# Patient Record
Sex: Female | Born: 1960 | Race: White | Hispanic: No | Marital: Single | Smoking: Never smoker
Health system: Southern US, Community
[De-identification: ages and names within clinical notes are randomized; demographics above are authoritative.]

## PROBLEM LIST (undated history)

## (undated) DIAGNOSIS — Z1211 Encounter for screening for malignant neoplasm of colon: Secondary | ICD-10-CM

## (undated) DIAGNOSIS — Z1231 Encounter for screening mammogram for malignant neoplasm of breast: Principal | ICD-10-CM

## (undated) DIAGNOSIS — E039 Hypothyroidism, unspecified: Secondary | ICD-10-CM

## (undated) DIAGNOSIS — Z780 Asymptomatic menopausal state: Secondary | ICD-10-CM

---

## 2014-07-26 ENCOUNTER — Encounter

## 2014-07-28 ENCOUNTER — Inpatient Hospital Stay: Admit: 2014-07-28 | Payer: PRIVATE HEALTH INSURANCE | Attending: Family Medicine | Primary: Family Medicine

## 2014-07-28 DIAGNOSIS — Z1231 Encounter for screening mammogram for malignant neoplasm of breast: Secondary | ICD-10-CM

## 2015-06-02 ENCOUNTER — Other Ambulatory Visit: Admit: 2015-06-02 | Discharge: 2015-06-02 | Payer: BLUE CROSS/BLUE SHIELD | Primary: Family Medicine

## 2015-06-02 DIAGNOSIS — Z0000 Encounter for general adult medical examination without abnormal findings: Secondary | ICD-10-CM

## 2015-06-02 LAB — AMB POC URINALYSIS DIP STICK AUTO W/O MICRO
Bilirubin (UA POC): NEGATIVE
Glucose (UA POC): NEGATIVE
Ketones (UA POC): NEGATIVE
Leukocyte esterase (UA POC): NEGATIVE
Nitrites (UA POC): NEGATIVE
Protein (UA POC): NEGATIVE mg/dL
Specific gravity (UA POC): 1.02 (ref 1.001–1.035)
Urobilinogen (UA POC): 0.2 (ref 0.2–1)
pH (UA POC): 5.5 (ref 4.6–8.0)

## 2015-06-03 LAB — HEPATITIS C ANTIBODY: HCV Ab: <0.1 s/co ratio (ref 0.0–0.9)

## 2015-06-03 LAB — METABOLIC PANEL, COMPREHENSIVE
A-G Ratio: 1.5 (ref 1.1–2.5)
ALT (SGPT): 13 IU/L (ref 0–32)
AST (SGOT): 20 IU/L (ref 0–40)
Albumin: 4 g/dL (ref 3.5–5.5)
Alk. phosphatase: 52 IU/L (ref 39–117)
BUN/Creatinine ratio: 12 (ref 9–23)
BUN: 11 mg/dL (ref 6–24)
Bilirubin, total: 0.5 mg/dL (ref 0.0–1.2)
CO2: 25 mmol/L (ref 18–29)
Calcium: 9.2 mg/dL (ref 8.7–10.2)
Chloride: 102 mmol/L (ref 96–106)
Creatinine: 0.92 mg/dL (ref 0.57–1.00)
GFR est AA: 82 mL/min/{1.73_m2} (ref >59)
GFR est non-AA: 71 mL/min/{1.73_m2} (ref >59)
GLOBULIN, TOTAL: 2.7 g/dL (ref 1.5–4.5)
Glucose: 89 mg/dL (ref 65–99)
Potassium: 4.3 mmol/L (ref 3.5–5.2)
Protein, total: 6.7 g/dL (ref 6.0–8.5)
Sodium: 143 mmol/L (ref 134–144)

## 2015-06-03 LAB — CBC WITH AUTOMATED DIFF
ABS. BASOPHILS: 0 10*3/uL (ref 0.0–0.2)
ABS. EOSINOPHILS: 0.2 10*3/uL (ref 0.0–0.4)
ABS. IMM. GRANS.: 0 10*3/uL (ref 0.0–0.1)
ABS. MONOCYTES: 0.5 10*3/uL (ref 0.1–0.9)
ABS. NEUTROPHILS: 3.5 10*3/uL (ref 1.4–7.0)
Abs Lymphocytes: 1.5 10*3/uL (ref 0.7–3.1)
BASOPHILS: 1 %
EOSINOPHILS: 4 %
HCT: 41.5 % (ref 34.0–46.6)
HGB: 13.7 g/dL (ref 11.1–15.9)
IMMATURE GRANULOCYTES: 0 %
Lymphocytes: 26 %
MCH: 31 pg (ref 26.6–33.0)
MCHC: 33 g/dL (ref 31.5–35.7)
MCV: 94 fL (ref 79–97)
MONOCYTES: 9 %
NEUTROPHILS: 60 %
PLATELET: 209 10*3/uL (ref 150–379)
RBC: 4.42 x10E6/uL (ref 3.77–5.28)
RDW: 14.5 % (ref 12.3–15.4)
WBC: 5.8 10*3/uL (ref 3.4–10.8)

## 2015-06-03 LAB — LIPID PANEL WITH LDL/HDL RATIO
Cholesterol, total: 232 mg/dL — ABNORMAL HIGH (ref 100–199)
HDL Cholesterol: 66 mg/dL (ref >39)
LDL, calculated: 149 mg/dL — ABNORMAL HIGH (ref 0–99)
LDL/HDL Ratio: 2.3 ratio units (ref 0.0–3.2)
Triglyceride: 84 mg/dL (ref 0–149)
VLDL, calculated: 17 mg/dL (ref 5–40)

## 2015-06-03 LAB — TSH 3RD GENERATION: TSH: 4.51 u[IU]/mL — ABNORMAL HIGH (ref 0.450–4.500)

## 2015-06-03 LAB — HEPATITIS C AB: Hep C Virus Ab: <0.1 s/co ratio (ref 0.0–0.9)

## 2015-06-07 ENCOUNTER — Ambulatory Visit
Admit: 2015-06-07 | Discharge: 2015-06-07 | Payer: BLUE CROSS/BLUE SHIELD | Attending: Nurse Practitioner | Primary: Family Medicine

## 2015-06-07 DIAGNOSIS — Z1239 Encounter for other screening for malignant neoplasm of breast: Secondary | ICD-10-CM

## 2015-06-07 NOTE — Progress Notes (Signed)
Alyssa Reese  [id number]  [birth year]    Chief Complaint:   Chief Complaint   Patient presents with   ??? Complete Physical     last mammogram was 07-26-14; no colonoscopy yet; last pap done last year at [facility]     History of Present Illness:  Alyssa Reese is a 55 y.o. female who presents for annual adult CPX.  She is currently doing well, denies complaints and concerns.  Labs reviewed, lipid panel, diet and exercise discussed.  Plan for repeat labs in 6 months.  HM reviewed--she is agreeable with mammogram, immunization and is considering colonoscopy vs cologuard.  No previous DERM or eye exam.  She is agreeable with these referrals.    Review of Systems:  Review of Systems   Constitutional: Negative.    HENT: Negative.    Eyes: Negative.    Respiratory: Negative.    Cardiovascular: Negative.    Gastrointestinal: Negative.    Genitourinary: Negative.    Musculoskeletal: Negative.    Skin: Negative.    Neurological: Negative.    Endo/Heme/Allergies: Negative.    Psychiatric/Behavioral: Negative.        Medications:  Current Outpatient Prescriptions   Medication Sig   ??? multivitamin (ONE A DAY) tablet Take 1 Tab by mouth daily.     No current facility-administered medications for this visit.        Past Medical History:  Past Medical History   Diagnosis Date   ??? Fibrocystic disease of both breasts    ??? Shingles 05/02/2015       Surgical History:  History reviewed. No pertinent past surgical history.    Family History:  Family History   Problem Relation Age of Onset   ??? Breast Cancer Paternal Aunt    ??? Cancer Father      kidney cancer   ??? Cancer Paternal Grandmother 70     colon       Social History:  Social History     Social History   ??? Marital status: SINGLE     Spouse name: N/A   ??? Number of children: N/A   ??? Years of education: N/A     Occupational History   ??? Not on file.     Social History Main Topics   ??? Smoking status: Former Smoker     Quit date: 06/19/1998   ??? Smokeless tobacco: Never Used    ??? Alcohol use Not on file   ??? Drug use: Not on file   ??? Sexual activity: Not on file     Other Topics Concern   ??? Not on file     Social History Narrative       History   Smoking Status   ??? Former Smoker   ??? Quit date: 06/19/1998   Smokeless Tobacco   ??? Never Used       Allergies:  Allergies   Allergen Reactions   ??? Shellfish Derived Nausea and Vomiting       Vital Signs:  Vitals:    06/07/15 1310   BP: 100/62   Weight: 151 lb (68.5 kg)   Height: 5' 4.5" (1.638 m)     Body mass index is 25.52 kg/(m^2).    Lab/Imaging Results:  Results for orders placed or performed in visit on 06/02/15   CBC WITH AUTOMATED DIFF   Result Value Ref Range    WBC 5.8 3.4 - 10.8 x10E3/uL    RBC 4.42 3.77 - 5.28 x10E6/uL  HGB 13.7 11.1 - 15.9 g/dL    HCT 41.5 34.0 - 46.6 %    MCV 94 79 - 97 fL    MCH 31.0 26.6 - 33.0 pg    MCHC 33.0 31.5 - 35.7 g/dL    RDW 14.5 12.3 - 15.4 %    PLATELET 209 150 - 379 x10E3/uL    NEUTROPHILS 60 %    Lymphocytes 26 %    MONOCYTES 9 %    EOSINOPHILS 4 %    BASOPHILS 1 %    ABS. NEUTROPHILS 3.5 1.4 - 7.0 x10E3/uL    Abs Lymphocytes 1.5 0.7 - 3.1 x10E3/uL    ABS. MONOCYTES 0.5 0.1 - 0.9 x10E3/uL    ABS. EOSINOPHILS 0.2 0.0 - 0.4 x10E3/uL    ABS. BASOPHILS 0.0 0.0 - 0.2 x10E3/uL    IMMATURE GRANULOCYTES 0 %    ABS. IMM. GRANS. 0.0 0.0 - 0.1 R67E9/FY   METABOLIC PANEL, COMPREHENSIVE   Result Value Ref Range    Glucose 89 65 - 99 mg/dL    BUN 11 6 - 24 mg/dL    Creatinine 0.92 0.57 - 1.00 mg/dL    GFR est non-AA 71 >59 mL/min/1.73    GFR est AA 82 >59 mL/min/1.73    BUN/Creatinine ratio 12 9 - 23    Sodium 143 134 - 144 mmol/L    Potassium 4.3 3.5 - 5.2 mmol/L    Chloride 102 96 - 106 mmol/L    CO2 25 18 - 29 mmol/L    Calcium 9.2 8.7 - 10.2 mg/dL    Protein, total 6.7 6.0 - 8.5 g/dL    Albumin 4.0 3.5 - 5.5 g/dL    GLOBULIN, TOTAL 2.7 1.5 - 4.5 g/dL    A-G Ratio 1.5 1.1 - 2.5    Bilirubin, total 0.5 0.0 - 1.2 mg/dL    Alk. phosphatase 52 39 - 117 IU/L    AST 20 0 - 40 IU/L    ALT 13 0 - 32 IU/L    TSH 3RD GENERATION   Result Value Ref Range    TSH 4.510 (H) 0.450 - 4.500 uIU/mL   LIPID PANEL WITH LDL/HDL RATIO   Result Value Ref Range    Cholesterol, total 232 (H) 100 - 199 mg/dL    Triglyceride 84 0 - 149 mg/dL    HDL Cholesterol 66 >39 mg/dL    VLDL, calculated 17 5 - 40 mg/dL    LDL, calculated 149 (H) 0 - 99 mg/dL    LDL/HDL Ratio 2.3 0.0 - 3.2 ratio units   HEPATITIS C AB   Result Value Ref Range    Hep C Virus Ab <0.1 0.0 - 0.9 s/co ratio   AMB POC URINALYSIS DIP STICK AUTO W/O MICRO   Result Value Ref Range    Color (UA POC) Yellow     Clarity (UA POC) Clear     Glucose (UA POC) Negative Negative    Bilirubin (UA POC) Negative Negative    Ketones (UA POC) Negative Negative    Specific gravity (UA POC) 1.020 1.001 - 1.035    Blood (UA POC) Trace Negative    pH (UA POC) 5.5 4.6 - 8.0    Protein (UA POC) Negative Negative mg/dL    Urobilinogen (UA POC) 0.2 mg/dL 0.2 - 1    Nitrites (UA POC) Negative Negative    Leukocyte esterase (UA POC) Negative Negative         Physical Exam:  Physical Exam  Constitutional: She is oriented to person, place, and time and well-developed, well-nourished, and in no distress. Vital signs are normal.   HENT:   Head: Normocephalic and atraumatic.   Right Ear: Hearing, tympanic membrane, external ear and ear canal normal.   Left Ear: Hearing, tympanic membrane, external ear and ear canal normal.   Nose: Nose normal.   Mouth/Throat: Uvula is midline, oropharynx is clear and moist and mucous membranes are normal.   Eyes: Conjunctivae, EOM and lids are normal. Pupils are equal, round, and reactive to light.   Neck: Trachea normal, normal range of motion and full passive range of motion without pain. Neck supple. No JVD present. No tracheal deviation present. No thyromegaly present.   Cardiovascular: Normal rate, regular rhythm, normal heart sounds, intact distal pulses and normal pulses.    No murmur heard.  Pulmonary/Chest: Effort normal and breath sounds normal. No accessory  muscle usage. No respiratory distress. She has no decreased breath sounds. She has no wheezes. She has no rhonchi. She has no rales.   Abdominal: Soft. Normal appearance, normal aorta and bowel sounds are normal. She exhibits no distension. There is no hepatosplenomegaly. There is no tenderness. There is no rebound and no guarding.   Musculoskeletal: Normal range of motion. She exhibits no edema or tenderness.   Lymphadenopathy:     She has no cervical adenopathy.   Neurological: She is alert and oriented to person, place, and time. She has normal sensation, normal strength, normal reflexes and intact cranial nerves. Gait normal. GCS score is 15.   Skin: Skin is warm, dry and intact.   Psychiatric: Mood, memory, affect and judgment normal.   Nursing note and vitals reviewed.      Assessment and Plan:    ICD-10-CM ICD-9-CM    1. Breast cancer screening Z12.39 V76.10 MAM MAMMO BI SCREENING INCL CAD   2. Mixed hyperlipidemia E78.2 272.2    3. Screening Z13.9 V82.9 REFERRAL TO DERMATOLOGY      REFERRAL TO OPHTHALMOLOGY   4. Encounter for immunization Z23 V03.89        Considering colonoscopy vs cologuard  Follow-up Disposition:  Return in about 6 months (around 12/05/2015) for repeat fasting lipid panel and TSH.      Milagros Loll, NP

## 2015-06-07 NOTE — Patient Instructions (Signed)
Vaccine Information Statement    Influenza (Flu) Vaccine (Inactivated or Recombinant): What you need to know    Many Vaccine Information Statements are available in Spanish and other languages. See www.immunize.org/vis  Hojas de Informaci??n Sobre Vacunas est??n disponibles en Espa??ol y en muchos otros idiomas. Visite www.immunize.org/vis    1. Why get vaccinated?    Influenza (???flu???) is a contagious disease that spreads around the [place] every year, usually between October and May.     Flu is caused by influenza viruses, and is spread mainly by coughing, sneezing, and close contact.     Anyone can get flu. Flu strikes suddenly and can last several days. Symptoms vary by age, but can include:  ??? fever/chills  ??? sore throat  ??? muscle aches  ??? fatigue  ??? cough  ??? headache   ??? runny or stuffy nose    Flu can also lead to pneumonia and blood infections, and cause diarrhea and seizures in children.  If you have a medical condition, such as heart or lung disease, flu can make it worse.    Flu is more dangerous for some people. Infants and young children, people 65 years of age and older, pregnant women, and people with certain health conditions or a weakened immune system are at greatest risk.      Each year thousands of people in the [place] die from flu, and many more are hospitalized.     Flu vaccine can:  ??? keep you from getting flu,  ??? make flu less severe if you do get it, and  ??? keep you from spreading flu to your family and other people.     2. Inactivated and recombinant flu vaccines    A dose of flu vaccine is recommended every flu season. Children 6 months through 8 years of age may need two doses during the same flu season.  Everyone else needs only one dose each flu season.       Some inactivated flu vaccines contain a very small amount of a mercury-based preservative called thimerosal. Studies have not shown thimerosal in vaccines to be harmful, but flu vaccines that do not contain  thimerosal are available.    There is no live flu virus in flu shots.  They cannot cause the flu.     There are many flu viruses, and they are always changing. Each year a new flu vaccine is made to protect against three or four viruses that are likely to cause disease in the upcoming flu season. But even when the vaccine doesn???t exactly match these viruses, it may still provide some protection    Flu vaccine cannot prevent:  ??? flu that is caused by a virus not covered by the vaccine, or  ??? illnesses that look like flu but are not.    It takes about 2 weeks for protection to develop after vaccination, and protection lasts through the flu season.     3. Some people should not get this vaccine    Tell the person who is giving you the vaccine:    ??? If you have any severe, life-threatening allergies.    If you ever had a life-threatening allergic reaction after a dose of flu vaccine, or have a severe allergy to any part of this vaccine, you may be advised not to get vaccinated.  Most, but not all, types of flu vaccine contain a small amount of egg protein.       ??? If you   ever had Guillain-Barr?? Syndrome (also called GBS).   Some people with a history of GBS should not get this vaccine. This should be discussed with your doctor.    ??? If you are not feeling well.    It is usually okay to get flu vaccine when you have a mild illness, but you might be asked to come back when you feel better.      4. Risks of a vaccine reaction    With any medicine, including vaccines, there is a chance of reactions. These are usually mild and go away on their own, but serious reactions are also possible.     Most people who get a flu shot do not have any problems with it.     Minor problems following a flu shot include:   ??? soreness, redness, or swelling where the shot was given    ??? hoarseness  ??? sore, red or itchy eyes  ??? cough  ??? fever  ??? aches  ??? headache  ??? itching  ??? fatigue   If these problems occur, they usually begin soon after the shot and last 1 or 2 days.     More serious problems following a flu shot can include the following:    ??? There may be a small increased risk of Guillain-Barr?? Syndrome (GBS) after inactivated flu vaccine.  This risk has been estimated at 1 or 2 additional cases per million people vaccinated. This is much lower than the risk of severe complications from flu, which can be prevented by flu vaccine.      ??? Young children who get the flu shot along with pneumococcal vaccine (PCV13) and/or DTaP vaccine at the same time might be slightly more likely to have a seizure caused by fever. Ask your doctor for more information. Tell your doctor if a child who is getting flu vaccine has ever had a seizure.     Problems that could happen after any injected vaccine:     ??? People sometimes faint after a medical procedure, including vaccination. Sitting or lying down for about 15 minutes can help prevent fainting, and injuries caused by a fall. Tell your doctor if you feel dizzy, or have vision changes or ringing in the ears.    ??? Some people get severe pain in the shoulder and have difficulty moving the arm where a shot was given. This happens very rarely.    ??? Any medication can cause a severe allergic reaction. Such reactions from a vaccine are very rare, estimated at about 1 in a million doses, and would happen within a few minutes to a few hours after the vaccination.    As with any medicine, there is a very remote chance of a vaccine causing a serious injury or death.    The safety of vaccines is always being monitored. For more information, visit: www.cdc.gov/vaccinesafety/    5. What if there is a serious reaction?    What should I look for?    ??? Look for anything that concerns you, such as signs of a severe allergic reaction, very high fever, or unusual behavior.    Signs of a severe allergic reaction can include hives, swelling of the  face and throat, difficulty breathing, a fast heartbeat, dizziness, and weakness ??? usually within a few minutes to a few hours after the vaccination.    What should I do?    ??? If you think it is a severe allergic reaction or other emergency that   can???t wait, call 9-1-1 and get the person to the nearest hospital. Otherwise, call your doctor.    ??? Reactions should be reported to the Vaccine Adverse Event Reporting System (VAERS). Your doctor should file this report, or you can do it yourself through  the VAERS web site at www.vaers.hhs.gov, or by calling 1-800-822-7967.    VAERS does not give medical advice.    6. The National Vaccine Injury Compensation Program    The National Vaccine Injury Compensation Program (VICP) is a federal program that was created to compensate people who may have been injured by certain vaccines.    Persons who believe they may have been injured by a vaccine can learn about the program and about filing a claim by calling 1-800-338-2382 or visiting the VICP website at www.hrsa.gov/vaccinecompensation.  There is a time limit to file a claim for compensation.    7. How can I learn more?  ??? Ask your healthcare provider. He or she can give you the vaccine package insert or suggest other sources of information.  ??? Call your local or state health department.  ??? Contact the Centers for Disease Control and Prevention (CDC):  - Call 1-800-232-4636 (1-800-CDC-INFO) or  - Visit CDC???s website at www.cdc.gov/flu    Vaccine Information Statement   Inactivated Influenza Vaccine   12/24/2013  42 [place] ?? 300aa-[id number]    Department of Health and Human Services  Centers for Disease Control and Prevention    Office Use Only

## 2015-07-06 ENCOUNTER — Telehealth

## 2015-07-06 NOTE — Telephone Encounter (Signed)
[employer] will not pay for cologuard.  Pt requesting referral to [facility] for colonoscopy.

## 2015-07-06 NOTE — Telephone Encounter (Signed)
Referral for Screening colonoscopy placed.

## 2015-07-31 ENCOUNTER — Inpatient Hospital Stay: Admit: 2015-07-31 | Payer: BLUE CROSS/BLUE SHIELD | Primary: Family Medicine

## 2015-07-31 DIAGNOSIS — Z1231 Encounter for screening mammogram for malignant neoplasm of breast: Secondary | ICD-10-CM

## 2015-08-01 NOTE — Progress Notes (Signed)
Normal mammo, notified by radiology staff

## 2016-01-05 ENCOUNTER — Other Ambulatory Visit: Admit: 2016-01-05 | Discharge: 2016-01-05 | Payer: BLUE CROSS/BLUE SHIELD | Primary: Family Medicine

## 2016-01-05 DIAGNOSIS — R6889 Other general symptoms and signs: Secondary | ICD-10-CM

## 2016-01-06 LAB — LIPID PANEL WITH LDL/HDL RATIO
Cholesterol, total: 210 mg/dL — ABNORMAL HIGH (ref 100–199)
HDL Cholesterol: 67 mg/dL (ref >39)
LDL, calculated: 131 mg/dL — ABNORMAL HIGH (ref 0–99)
LDL/HDL Ratio: 2 ratio units (ref 0.0–3.2)
Triglyceride: 58 mg/dL (ref 0–149)
VLDL, calculated: 12 mg/dL (ref 5–40)

## 2016-01-06 LAB — TSH 3RD GENERATION: TSH: 4.43 u[IU]/mL (ref 0.450–4.500)

## 2016-01-10 NOTE — Progress Notes (Signed)
Labs mailed 01/10/16  pl

## 2016-01-15 NOTE — Telephone Encounter (Signed)
Pt notified of results and voiced understanding.

## 2016-01-15 NOTE — Telephone Encounter (Signed)
Pt calling to get lab results

## 2016-05-06 ENCOUNTER — Encounter: Attending: Nurse Practitioner | Primary: Family Medicine

## 2016-05-07 ENCOUNTER — Ambulatory Visit
Admit: 2016-05-07 | Discharge: 2016-05-07 | Payer: BLUE CROSS/BLUE SHIELD | Attending: Nurse Practitioner | Primary: Family Medicine

## 2016-05-07 DIAGNOSIS — N951 Menopausal and female climacteric states: Secondary | ICD-10-CM

## 2016-05-07 NOTE — Progress Notes (Signed)
Alyssa Reese  [id number]  12/13/1960    Chief Complaint:   Chief Complaint   Patient presents with   ??? Menopause     "menopausal changes".  "hot flashes have gotten really bad".  Has used OTC remedies with no improvement.      History of Present Illness:  See above. Presents with c/o perimenopausal symptoms, started approx 5 years ago, seems to be worsening over the past few months. A/w emotional lability, painful intercourse. Patient has h/o "anger issues, hot-tempered" even prior to menopause. Last menstrual cycle was January 2017. No personal history of cancer. Paternal aunt with breast cancer. No other breast cancer in the family. She is UTD with screenings for mammogram and colonoscopy.  Discussed treatment options for HRT vs effexor and intrarosa.    Review of Systems:  Review of Systems   Constitutional: Negative.         Hotflashes   HENT: Negative.    Eyes: Negative.    Respiratory: Negative.    Cardiovascular: Negative.    Gastrointestinal: Negative.    Genitourinary: Negative.         Vaginal dryness   Musculoskeletal: Negative.    Skin: Negative.    Neurological: Negative.    Endo/Heme/Allergies: Negative.    Psychiatric/Behavioral: Negative.         Labile moods         Medications:  Current Outpatient Prescriptions   Medication Sig   ??? cyanocobalamin (VITAMIN B-12) 1,000 mcg tablet Take 1,000 mcg by mouth daily.   ??? multivitamin (ONE A DAY) tablet Take 1 Tab by mouth daily.     No current facility-administered medications for this visit.        Past Medical History:  Past Medical History:   Diagnosis Date   ??? Fibrocystic disease of both breasts    ??? Shingles 05/02/2015       Surgical History:  Past Surgical History:   Procedure Laterality Date   ??? HX PREMALIG/BENIGN SKIN LESION EXCISION  08/2015    chest       Family History:  Family History   Problem Relation Age of Onset   ??? Breast Cancer Paternal Aunt    ??? Cancer Father      kidney cancer   ??? Cancer Paternal Grandmother [id number]     colon        Social History:  Social History     Social History   ??? Marital status: SINGLE     Spouse name: N/A   ??? Number of children: N/A   ??? Years of education: N/A     Occupational History   ??? Not on file.     Social History Main Topics   ??? Smoking status: Former Smoker     Quit date: 06/19/1998   ??? Smokeless tobacco: Never Used   ??? Alcohol use 1.8 oz/week     3 Glasses of wine per week   ??? Drug use: Not on file   ??? Sexual activity: Not on file     Other Topics Concern   ??? Not on file     Social History Narrative       History   Smoking Status   ??? Former Smoker   ??? Quit date: 06/19/1998   Smokeless Tobacco   ??? Never Used       Allergies:  Allergies   Allergen Reactions   ??? Shellfish Derived Nausea and Vomiting       Vital Signs:  Vitals:    05/07/16 1045   BP: 110/76   Pulse: 78   SpO2: 99%   Weight: 143 lb (64.9 kg)   Height: 5' 4.5" (1.638 m)     Body mass index is 24.17 kg/(m^2).    Lab/Imaging Results:  Results for orders placed or performed in visit on 01/05/16   TSH 3RD GENERATION   Result Value Ref Range    TSH 4.430 0.450 - 4.500 uIU/mL   LIPID PANEL WITH LDL/HDL RATIO   Result Value Ref Range    Cholesterol, total 210 (H) 100 - 199 mg/dL    Triglyceride 58 0 - 149 mg/dL    HDL Cholesterol 67 >16>39 mg/dL    VLDL, calculated 12 5 - 40 mg/dL    LDL, calculated 109131 (H) 0 - 99 mg/dL    LDL/HDL Ratio 2.0 0.0 - 3.2 ratio units         Physical Exam:  Physical Exam   Constitutional: She is oriented to person, place, and time and well-developed, well-nourished, and in no distress.   HENT:   Head: Normocephalic and atraumatic.   Eyes: EOM are normal.   Neck: Normal range of motion. Neck supple.   Pulmonary/Chest: Effort normal.   Musculoskeletal: Normal range of motion.   Neurological: She is alert and oriented to person, place, and time. Gait normal.   Skin: Skin is warm and dry.   Psychiatric: Mood, memory, affect and judgment normal.   Nursing note and vitals reviewed.      Assessment and Plan:    ICD-10-CM ICD-9-CM     1. Perimenopausal symptoms N95.1 627.2 PROGESTERONE      FSH AND LH      ESTRADIOL     Follow labs  Consider starting low dose HRT     Collier BullockJennifer D Rayfield Beem, NP

## 2016-05-08 LAB — ESTRADIOL: Estradiol: 35.2 pg/mL

## 2016-05-08 LAB — FSH AND LH
FSH: 69.7 m[IU]/mL
Luteinizing hormone: 64.2 m[IU]/mL

## 2016-05-08 LAB — PROGESTERONE: Progesterone: 0.1 ng/mL

## 2016-05-08 NOTE — Progress Notes (Signed)
3/4 labs confirm post-menopausal. Recommend trial of low dose estradiol 1 mg po daily for 21 days with 7 days off of medication. Follow up in office in 8 weeks.

## 2016-05-09 MED ORDER — ESTRADIOL 1 MG TAB
1 mg | ORAL_TABLET | ORAL | 2 refills | Status: DC
Start: 2016-05-09 — End: 2016-06-27

## 2016-05-09 NOTE — Progress Notes (Signed)
Labs reviewed with patient.  She is agreeable to start estradiol and will schedule f/u in 2 months.

## 2016-06-04 ENCOUNTER — Encounter: Attending: Nurse Practitioner | Primary: Family Medicine

## 2016-06-27 ENCOUNTER — Ambulatory Visit
Admit: 2016-06-27 | Discharge: 2016-06-27 | Payer: BLUE CROSS/BLUE SHIELD | Attending: Nurse Practitioner | Primary: Family Medicine

## 2016-06-27 DIAGNOSIS — R232 Flushing: Secondary | ICD-10-CM

## 2016-06-27 MED ORDER — ESTRADIOL 0.05 MG-NORETHINDRONE 0.14 MG/24 HR SEMIWKLY TRANSDERM PATCH
MEDICATED_PATCH | TRANSDERMAL | 2 refills | Status: DC
Start: 2016-06-27 — End: 2016-07-01

## 2016-06-27 NOTE — Telephone Encounter (Signed)
Pt just seen today and her Estradiol patch cost $200.  Would like to try 2 mg instead.

## 2016-06-27 NOTE — Progress Notes (Signed)
Alyssa Reese  [id number]  07/12/1960    Chief Complaint:   Chief Complaint   Patient presents with   ??? Menopause     follow up after being placed on estradiol for post menopausal flushing.  Reports some improvement, "but not much"     History of Present Illness:  At previous visit, patient requested trial of estradiol, which does not seem to be helping. Explained the need to add progesterone, recommended combipatch after discussion with her PCP. Patient declined due to cost. Recommended alternative treatment. Also discussed trial of SSRI for hotflashes and mood swings but patient declines this as well.     Review of Systems:  Review of Systems   Constitutional: Negative.         Hot flashes  Mood swings  Vagina dryness   HENT: Negative.    Eyes: Negative.    Respiratory: Negative.    Cardiovascular: Negative.    Gastrointestinal: Negative.    Genitourinary: Negative.    Musculoskeletal: Negative.    Skin: Negative.    Neurological: Negative.    Endo/Heme/Allergies: Negative.    Psychiatric/Behavioral: Negative.        Medications:  Current Outpatient Prescriptions   Medication Sig   ??? estradiol (ESTRACE) 1 mg tablet 1 tablet daily for 21 days;  Off for 7 days.   ??? cyanocobalamin (VITAMIN B-12) 1,000 mcg tablet Take 1,000 mcg by mouth daily.   ??? multivitamin (ONE A DAY) tablet Take 1 Tab by mouth daily.     No current facility-administered medications for this visit.        Past Medical History:  Past Medical History:   Diagnosis Date   ??? Fibrocystic disease of both breasts    ??? Post-menopause     hot flashes   ??? Shingles 05/02/2015   ??? Skin cancer     Basal cell of left chest       Surgical History:  Past Surgical History:   Procedure Laterality Date   ??? HX PREMALIG/BENIGN SKIN LESION EXCISION  08/2015    chest       Family History:  Family History   Problem Relation Age of Onset   ??? Breast Cancer Paternal Aunt    ??? Cancer Father      kidney cancer   ??? Cancer Paternal Grandmother [phone]     colon       Social History:   Social History     Social History   ??? Marital status: SINGLE     Spouse name: N/A   ??? Number of children: N/A   ??? Years of education: N/A     Occupational History   ??? Not on file.     Social History Main Topics   ??? Smoking status: Former Smoker     Quit date: 06/19/1998   ??? Smokeless tobacco: Never Used   ??? Alcohol use 1.8 oz/week     3 Glasses of wine per week   ??? Drug use: Not on file   ??? Sexual activity: Not on file     Other Topics Concern   ??? Not on file     Social History Narrative       History   Smoking Status   ??? Former Smoker   ??? Quit date: 06/19/1998   Smokeless Tobacco   ??? Never Used       Allergies:  Allergies   Allergen Reactions   ??? Shellfish Derived Nausea and Vomiting  Vital Signs:  Vitals:    06/27/16 0857   BP: 102/70   Pulse: 68   SpO2: 98%   Weight: 145 lb (65.8 kg)   Height: 5\' 4"  (1.626 m)     Body mass index is 24.89 kg/(m^2).    Lab/Imaging Results:  Results for orders placed or performed in visit on 05/07/16   [facility]   Result Value Ref Range    Luteinizing hormone 64.2 mIU/mL    FSH 69.7 mIU/mL   ESTRADIOL   Result Value Ref Range    Estradiol 35.2 pg/mL   PROGESTERONE   Result Value Ref Range    Progesterone 0.1 ng/mL       Physical Exam:  Physical Exam   Constitutional: She is oriented to person, place, and time and well-developed, well-nourished, and in no distress.   HENT:   Head: Normocephalic and atraumatic.   Eyes: EOM are normal.   Neck: Normal range of motion. Neck supple.   Pulmonary/Chest: Effort normal.   Musculoskeletal: Normal range of motion.   Neurological: She is alert and oriented to person, place, and time. Gait normal.   Skin: Skin is warm and dry.   Psychiatric: Mood, memory and judgment normal. She has a flat affect.   Nursing note and vitals reviewed.        Assessment and Plan:    ICD-10-CM ICD-9-CM    1. Vasomotor flushing R23.2 782.62 norethindrone-ethinyl estradiol (FEMHRT LOW DOSE) 0.5-2.5 mg-mcg per tablet       DISCONTINUED: estradiol-norethindrone (COMBIPATCH) 0.05-0.14 mg/24 hr   2. Post-menopause Z78.0 V49.81 norethindrone-ethinyl estradiol (FEMHRT LOW DOSE) 0.5-2.5 mg-mcg per tablet      DISCONTINUED: estradiol-norethindrone (COMBIPATCH) 0.05-0.14 mg/24 hr         Collier Bullock, NP

## 2016-06-28 NOTE — Telephone Encounter (Signed)
Dr Alveda ReasonsGiambalvo did not recommend Estrace but did recommend Combipatch because pt needs estrogen-progesterone combination.  Pt did not absorb po med well but can try Prempro or FemHRT if desired.  Pt needs to check with insurance for coverage/cost of Prempro or FemHRT.

## 2016-07-01 MED ORDER — NORETHINDRONE ACETATE 0.5 MG-ETHINYL ESTRADIOL 2.5 MCG TABLET
ORAL_TABLET | Freq: Every day | ORAL | 5 refills | Status: DC
Start: 2016-07-01 — End: 2016-08-28

## 2016-07-01 NOTE — Telephone Encounter (Signed)
[facility]  Patient notified of change and recommended to check with pharmacy for goodRx card.  Call with any additional concerns.  Maryagnes Amos/jde

## 2016-07-03 ENCOUNTER — Encounter: Attending: Obstetrics & Gynecology | Primary: Family Medicine

## 2016-07-22 ENCOUNTER — Ambulatory Visit
Admit: 2016-07-22 | Discharge: 2016-07-22 | Payer: BLUE CROSS/BLUE SHIELD | Attending: Obstetrics & Gynecology | Primary: Family Medicine

## 2016-07-22 DIAGNOSIS — Z01419 Encounter for gynecological examination (general) (routine) without abnormal findings: Secondary | ICD-10-CM

## 2016-07-22 NOTE — Progress Notes (Signed)
HPI  Alyssa Reese is a 56 y.o. female seen for annual GYN exam.    Past Medical History, Past Surgical History, Family history, Social History, and Medications were all reviewed with the patient today and updated as necessary.     Current Outpatient Prescriptions   Medication Sig   ??? norethindrone-ethinyl estradiol (FEMHRT LOW DOSE) 0.5-2.5 mg-mcg per tablet Take 1 Tab by mouth daily. Indications: VASOMOTOR SYMPTOMS ASSOCIATED WITH MENOPAUSE   ??? cyanocobalamin (VITAMIN B-12) 1,000 mcg tablet Take 1,000 mcg by mouth daily.     No current facility-administered medications for this visit.      Allergies   Allergen Reactions   ??? Shellfish Derived Nausea and Vomiting     Past Medical History:   Diagnosis Date   ??? Fibrocystic disease of both breasts    ??? Post-menopause     hot flashes   ??? Shingles 05/02/2015   ??? Skin cancer     Basal cell of left chest     Past Surgical History:   Procedure Laterality Date   ??? HX PREMALIG/BENIGN SKIN LESION EXCISION  08/2015    chest     Family History   Problem Relation Age of Onset   ??? Breast Cancer Paternal Aunt    ??? Cancer Father      kidney cancer   ??? Cancer Paternal Grandmother [phone]     colon      Social History   Substance Use Topics   ??? Smoking status: Former Smoker     Quit date: 06/19/1998   ??? Smokeless tobacco: Never Used   ??? Alcohol use 1.8 oz/week     3 Glasses of wine per week      Comment: occ       History   Sexual Activity   ??? Sexual activity: No     Obstetric History    G0   P0   T0   P0   A0   L0     SAB0   TAB0   Ectopic0   Molar0   Multiple0   Live Births0           Health Maintenance  Mammogram: 07-31-2015  Colonoscopy: 2017 Benign polyps repeat 5 years  Bone Density:  Pap smear:2015 No Abn    ROS:  Review of Systems  General: Not Present- Appetite Loss, Chills, Excessive Crying, Fatigue, Fever, Night Sweats and Tiredness.  Skin: Not Present- Bruising, Change in Wart/Mole, Excessive Sweating, Itching, Nail Changes, New Lesions, Rash, Skin Color Changes and Ulcer.   HEENT: Not Present- Headache, Blurred Vision, Double Vision, Glaucoma, Visual Disturbances, Hearing Loss, Ringing in the Ears, Vertigo, Nose Bleed, Bleeding Gums, Hoarseness and Sore Throat.  Neck: Not Present- Neck Pain and Neck Swelling.  Respiratory: Not Present- Cough, Difficulty Breathing and Difficulty Breathing on Exertion.  Breast: Not Present- Breast Mass, Breast Pain, Breast Swelling, Nipple Discharge, Nipple Pain, Recent Breast Size Changes and Skin Changes.  Cardiovascular: Not Present- Abnormal Blood Pressure, Chest Pain, Edema, Fainting / Blacking Out, Palpitations, Shortness of Breath and Swelling of Extremities.  Gastrointestinal: Not Present- Abdominal Pain, Abdominal Swelling, Bloating, Change in Bowel Habits, Constipation, Diarrhea, Difficulty Swallowing, Gets full quickly at meals, Nausea, Rectal Bleeding and Vomiting.  Female Genitourinary: Not Present- Dysmenorrhea, Dyspareunia, Excessive Menstrual Bleeding, Menstrual Irregularities, Pelvic Pain, Urinary Complaints, Vaginal Discharge, Vaginal dryness and Vaginal itching/burning.  Musculoskeletal: Not Present- Joint Pain and Muscle Pain.  Neurological: Not Present- Dizziness, Fainting, Headaches and Seizures.  Psychiatric: Not Present- Anxiety,  Depression, Mood changes and Panic Attacks.  Endocrine: Not Present- Appetite Changes, Cold Intolerance, Excessive Thirst, Excessive Urination and Heat Intolerance.  Hematology: Not Present- Abnormal Bleeding, Easy Bruising and Enlarged Lymph Nodes.            PHYSICAL EXAM:     Visit Vitals   ??? BP 98/60   ??? Ht 5\' 5"  (1.651 m)   ??? Wt 143 lb (64.9 kg)   ??? LMP 06/06/2015 (Exact Date)   ??? BMI 23.8 kg/m2     Physical Exam   General   Mental Status - Alert. General Appearance - Cooperative.     Integumentary   General Characteristics: Overall examination of the patient's skin reveals - no rashes and no suspicious lesions.     Head and Neck  Head - normocephalic, atraumatic with no lesions or palpable masses.    Neck Note: Normal   Thyroid   Gland Characteristics - normal size and consistency and no palpable nodules.     Chest and Lung Exam   Chest and lung exam reveals - on auscultation, normal breath sounds, no adventitious sounds and normal vocal resonance.     Breast   Breast - Left - Normal. Right - Normal.     Cardiovascular   Cardiovascular examination reveals - normal heart sounds, regular rate and rhythm with no murmurs.     Abdomen   Inspection: - Inspection Normal.   Palpation/Percussion: Palpation and Percussion of the abdomen reveal - Non Tender, No Rebound tenderness, No Rigidity (guarding), No hepatosplenomegaly, No Palpable abdominal masses and Soft.   Auscultation: Auscultation of the abdomen reveals - Bowel sounds normal.     Female Genitourinary     External Genitalia   Vulva: - Normal. Perineum - Normal. Bartholin's Gland - Bilateral - Normal. Clitoris - Normal.   Introitus: Characteristics - Normal.   Urethra: Characteristics - Normal.     Speculum & Bimanual   Vagina: Vaginal Mucosa - Normal.   Vaginal Wall: - Normal.   Vaginal Lesions - None.   Cervix: Characteristics - Normal.   Uterus: Characteristics - Normal.   Adnexa: - Normal.   Bladder - Normal.     Rectovaginal Exam   Sphincter Tone - Normal. Mass - None palpated.     Rectal   Anorectal Exam: - normal sphincter tone; no hemorrhoids and no masses or lesions of the perineum, anus or rectum.     Peripheral Vascular     Note: Normal  Neuropsychiatric   Examination of related systems reveals - The patient is well-nourished and well-groomed. Mental status exam performed with findings of - Oriented X3 with appropriate mood and affect.     Musculoskeletal    Note: Normal  Lymphatic  General Lymphatics   Description - Normal .            Medical problems and test results were reviewed with the patient today.     ASSESSMENT and PLAN    Diagnoses and all orders for this visit:    1. Well woman exam    2. Screening for malignant neoplasm of cervix   -     PAP IG, RFX APTIMA HPV ASCUS ([id number]))        Follow-up Disposition:  Return in about 1 year (around 07/22/2017).      Carney Harder, MD  07/22/2016

## 2016-07-23 LAB — PAP IG, RFX APTIMA HPV ASCUS (507800)
.: 0
LABCORP 019018: 0

## 2016-08-07 ENCOUNTER — Encounter

## 2016-08-08 ENCOUNTER — Ambulatory Visit

## 2016-08-08 ENCOUNTER — Inpatient Hospital Stay: Admit: 2016-08-08 | Payer: BLUE CROSS/BLUE SHIELD | Attending: Family Medicine | Primary: Family Medicine

## 2016-08-08 DIAGNOSIS — Z1231 Encounter for screening mammogram for malignant neoplasm of breast: Secondary | ICD-10-CM

## 2016-08-20 ENCOUNTER — Encounter: Attending: Nurse Practitioner | Primary: Family Medicine

## 2016-08-22 ENCOUNTER — Encounter: Attending: Family Medicine | Primary: Family Medicine

## 2016-08-28 ENCOUNTER — Ambulatory Visit
Admit: 2016-08-28 | Discharge: 2016-08-28 | Payer: BLUE CROSS/BLUE SHIELD | Attending: Family Medicine | Primary: Family Medicine

## 2016-08-28 DIAGNOSIS — E538 Deficiency of other specified B group vitamins: Secondary | ICD-10-CM

## 2016-08-28 MED ORDER — NORETHINDRONE ACETATE 0.5 MG-ETHINYL ESTRADIOL 2.5 MCG TABLET
ORAL_TABLET | Freq: Every day | ORAL | 12 refills | Status: AC
Start: 2016-08-28 — End: ?

## 2016-08-28 NOTE — Progress Notes (Signed)
Alyssa Reese is a 56 y.o. female who presents with   Chief Complaint   Patient presents with   ??? Hormone Problem     recheck after starting HRT, still having some hot flashes but greatly improved since starting HRT   ??? Vitamin B12 Deficiency     tingling in extremities, taking OTC sublingual, still having sxs with increased dosage although slightly improved       History of Present Illness    Pt was on HRT last year but taken off.  On FemHRT.  Hot flashes much improved.  Mood doing well.  Sleeping well.  Mammogram okay in 07/2016.  Pt is Vegan.  Had tingling in extremities, both feet and some L fingers- improved with SL B12. TSH was borderline elevated in August.     Review of Systems  Review of Systems   Constitutional: Negative for chills, fever and malaise/fatigue.   HENT: Negative for congestion and hearing loss.    Eyes: Negative for blurred vision, pain and discharge.   Respiratory: Negative for cough, hemoptysis, shortness of breath and wheezing.    Cardiovascular: Negative for chest pain, palpitations and leg swelling.   Gastrointestinal: Negative for abdominal pain, blood in stool, constipation, diarrhea, heartburn and nausea.   Genitourinary: Negative for dysuria, frequency, hematuria and urgency.   Musculoskeletal: Negative for joint pain and myalgias.   Skin: Negative for rash.   Neurological: Negative for dizziness, sensory change and headaches.   Endo/Heme/Allergies: Negative for polydipsia. Does not bruise/bleed easily.   Psychiatric/Behavioral: Negative for depression. The patient is not nervous/anxious and does not have insomnia.        Medications  Current Outpatient Prescriptions   Medication Sig   ??? multivitamin (ONE A DAY) tablet Take 1 Tab by mouth daily.   ??? norethindrone-ethinyl estradiol (FEMHRT LOW DOSE) 0.5-2.5 mg-mcg per tablet Take 1 Tab by mouth daily. Indications: VASOMOTOR SYMPTOMS ASSOCIATED WITH MENOPAUSE   ??? cyanocobalamin (VITAMIN B-12) 1,000 mcg tablet Take 1,000 mcg by mouth  daily.     No current facility-administered medications for this visit.        Past Medical History  Past Medical History:   Diagnosis Date   ??? Fibrocystic disease of both breasts    ??? Post-menopause     hot flashes   ??? Shingles 05/02/2015   ??? Skin cancer     Basal cell of left chest       Surgical History  Past Surgical History:   Procedure Laterality Date   ??? HX PREMALIG/BENIGN SKIN LESION EXCISION  08/2015    chest          Family History  Family History   Problem Relation Age of Onset   ??? Breast Cancer Paternal Aunt    ??? Cancer Father      kidney cancer   ??? Cancer Paternal Grandmother 65     colon       Social History  Social History     Social History   ??? Marital status: SINGLE     Spouse name: N/A   ??? Number of children: N/A   ??? Years of education: N/A     Occupational History   ??? Not on file.     Social History Main Topics   ??? Smoking status: Former Smoker     Quit date: 06/19/1998   ??? Smokeless tobacco: Never Used   ??? Alcohol use 1.8 oz/week     3 Glasses of wine per week  Comment: occ   ??? Drug use: No   ??? Sexual activity: No     Other Topics Concern   ??? Caffeine Concern Yes   ??? Exercise Yes   ??? Seat Belt Yes   ??? Self-Exams Yes     Social History Narrative    Denies any sexual or physical abuse and feels safe at home.       History   Smoking Status   ??? Former Smoker   ??? Quit date: 06/19/1998   Smokeless Tobacco   ??? Never Used       Allergies  Allergies   Allergen Reactions   ??? Shellfish Derived Nausea and Vomiting       Vital Signs  Body mass index is 24.23 kg/(m^2).  Vitals:    08/28/16 1017   BP: 114/70   Pulse: 80   SpO2: 98%   Weight: 145 lb 9.6 oz (66 kg)       Physical Exam  Physical Exam   Constitutional: She is well-developed, well-nourished, and in no distress.   HENT:   Right Ear: Tympanic membrane normal.   Left Ear: Tympanic membrane normal.   Mouth/Throat: No oropharyngeal exudate.   Neck: No thyromegaly present.   Cardiovascular: Normal rate, regular rhythm, normal heart sounds and  normal pulses.  Exam reveals no gallop and no friction rub.    No murmur heard.  Pulmonary/Chest: Breath sounds normal. No respiratory distress. She has no wheezes. She has no rales.   Musculoskeletal: She exhibits no edema.   Lymphadenopathy:     She has no cervical adenopathy.   Neurological: She has normal sensation, normal strength and normal reflexes.   Skin: Skin is warm and dry.       Assessment and Plan  Diagnoses and all orders for this visit:    1. B12 deficiency    2. Post-menopause  -     norethindrone-ethinyl estradiol (FEMHRT LOW DOSE) 0.5-2.5 mg-mcg per tablet; Take 1 Tab by mouth daily. Indications: VASOMOTOR SYMPTOMS ASSOCIATED WITH MENOPAUSE    3. Vasomotor flushing  -     Vitamin B12  -     Venipuncture  -     norethindrone-ethinyl estradiol (FEMHRT LOW DOSE) 0.5-2.5 mg-mcg per tablet; Take 1 Tab by mouth daily. Indications: VASOMOTOR SYMPTOMS ASSOCIATED WITH MENOPAUSE    4. Paresthesia  -     TSH  cont HRT and re-evaluate next year  Check labs  Cont B12 supplement

## 2016-08-29 ENCOUNTER — Encounter

## 2016-08-29 LAB — TSH 3RD GENERATION: TSH: 7.57 u[IU]/mL — ABNORMAL HIGH (ref 0.450–4.500)

## 2016-08-29 LAB — VITAMIN B12: Vitamin B12: >2000 pg/mL — ABNORMAL HIGH (ref 232–1245)

## 2016-08-29 MED ORDER — LEVOTHYROXINE 50 MCG TAB
50 mcg | ORAL_TABLET | Freq: Every day | ORAL | 2 refills | Status: AC
Start: 2016-08-29 — End: ?

## 2016-08-29 NOTE — Progress Notes (Signed)
LMOVM with permission per HIPAA form in chart advising pt of results and need for med.  Encouraged pt to call with any questions and to schedule lab appt.  Erxed med to pharmacy in chart.  Future lab order placed.

## 2016-09-30 ENCOUNTER — Encounter

## 2017-02-21 NOTE — Telephone Encounter (Signed)
FEMHRT was erxed 08-28-16 for a year's supply.  Pt will have rx transferred to new pharmacy.

## 2017-02-21 NOTE — Telephone Encounter (Signed)
Patient called [place] advise [place]. Asking if we can RF her HRT med for 1 mo.  [phone]

## 2017-03-11 ENCOUNTER — Ambulatory Visit: Payer: Self-pay | Admitting: Family Medicine

## 2017-03-31 NOTE — Progress Notes (Signed)
Medical records copied by Gar GibbonAnne Marie on 03/31/17 and faxed 03/31/17

## 2017-09-15 ENCOUNTER — Encounter

## 2019-08-05 ENCOUNTER — Ambulatory Visit
Admission: EM | Admit: 2019-08-05 | Discharge: 2019-08-05 | Disposition: A | Discharge status: Home / Self Care | Payer: BLUE CROSS/BLUE SHIELD

## 2019-08-05 ENCOUNTER — Other Ambulatory Visit: Payer: Self-pay

## 2019-08-05 ENCOUNTER — Ambulatory Visit (INDEPENDENT_AMBULATORY_CARE_PROVIDER_SITE_OTHER): Payer: BLUE CROSS/BLUE SHIELD

## 2019-08-05 DIAGNOSIS — R079 Chest pain, unspecified: Secondary | ICD-10-CM | POA: Diagnosis not present

## 2019-08-05 DIAGNOSIS — R0789 Other chest pain: Secondary | ICD-10-CM

## 2019-08-05 MED ORDER — IBUPROFEN 800 MG PO TABS: 800.0000 mg | ORAL_TABLET | Freq: Three times a day (TID) | ORAL | 0 refills | Status: DC

## 2019-08-05 MED ORDER — CYCLOBENZAPRINE HCL 10 MG PO TABS: 10.0000 mg | ORAL_TABLET | Freq: Two times a day (BID) | ORAL | 0 refills | Status: DC | PRN
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
## 2020-06-12 ENCOUNTER — Ambulatory Visit (INDEPENDENT_AMBULATORY_CARE_PROVIDER_SITE_OTHER): Payer: 59

## 2020-06-12 ENCOUNTER — Other Ambulatory Visit: Payer: Self-pay

## 2020-06-12 ENCOUNTER — Ambulatory Visit (INDEPENDENT_AMBULATORY_CARE_PROVIDER_SITE_OTHER): Payer: 59 | Admitting: Podiatrist

## 2020-06-12 ENCOUNTER — Encounter: Payer: Self-pay | Admitting: Podiatrist

## 2020-06-12 ENCOUNTER — Other Ambulatory Visit: Payer: Self-pay | Admitting: Podiatrist

## 2020-06-12 DIAGNOSIS — M79671 Pain in right foot: Secondary | ICD-10-CM

## 2020-06-12 DIAGNOSIS — M79672 Pain in left foot: Secondary | ICD-10-CM | POA: Diagnosis not present

## 2020-06-12 DIAGNOSIS — M722 Plantar fascial fibromatosis: Secondary | ICD-10-CM

## 2020-06-12 MED ORDER — ETODOLAC 500 MG PO TABS: 500.0000 mg | ORAL_TABLET | Freq: Two times a day (BID) | ORAL | 2 refills | Status: DC

## 2020-06-12 MED ORDER — TRIAMCINOLONE ACETONIDE 40 MG/ML IJ SUSP
40.0000 mg | Freq: Once | INTRAMUSCULAR | Status: AC
  Administered 2020-06-12: 40 mg

## 2020-06-12 NOTE — Patient Instructions (Signed)

## 2020-06-12 NOTE — Progress Notes (Signed)
Subjective: Marcia Gonzales is a 61 y.o. female patient presents to office with complaint of moderate heel pain on the heels bilaterally. Patient admits to post static dyskinesia for 6 plus months in duration. Patient has treated this problem with a medrol dose pack followed by Lodine which worked well while she was on the medications however she stated the pain returned when she went off the medication.   She relates she walks/jogs on her treadmill at home and experiences pain during and after exercise and with the first step in the morning.    There are no problems to display for this patient.   Current Outpatient Medications on File Prior to Visit  Medication Sig Dispense Refill  . fluticasone (FLONASE) 50 MCG/ACT nasal spray 2 sprays by Each Nare route daily.    Marland Kitchen levothyroxine (SYNTHROID) 50 MCG tablet Take by mouth.    . methylPREDNISolone (MEDROL DOSEPAK) 4 MG TBPK tablet See admin instructions.    . triamcinolone ointment (KENALOG) 0.1 % Apply topically.    . cyanocobalamin 1000 MCG tablet Take by mouth.    . cyclobenzaprine (FLEXERIL) 10 MG tablet Take 1 tablet (10 mg total) by mouth 2 (two) times daily as needed for muscle spasms. 20 tablet 0  . ibuprofen (ADVIL) 800 MG tablet Take 1 tablet (800 mg total) by mouth 3 (three) times daily. 30 tablet 0  . methylPREDNISolone (MEDROL DOSEPAK) 4 MG TBPK tablet Take by mouth as directed.    . minoxidil (ROGAINE) 2 % external solution Apply topically.    . Multiple Vitamin (DAILY VITES) tablet Take 1 tablet by mouth daily.    . Multiple Vitamin (MULTI-VITAMIN) tablet Take by mouth.    . Multiple Vitamin (MULTI-VITAMIN) tablet Take by mouth.    . Multiple Vitamin (MULTI-VITAMIN) tablet Take 2 tablets by mouth daily.    . norethindrone-ethinyl estradiol (FEMHRT LOW DOSE) 0.5-2.5 MG-MCG tablet Take 1 tablet by mouth daily.     No current facility-administered medications on file prior to visit.    Allergies  Allergen Reactions  . Shellfish  Allergy     Objective: Physical Exam General: The patient is alert and oriented x3 in no acute distress.  Dermatology: Skin is warm, dry and supple bilateral lower extremities. Nails 1-10 are normal. There is no erythema, edema, no eccymosis, no open lesions present. Integument is otherwise unremarkable.  Vascular: Dorsalis Pedis pulse and Posterior Tibial pulse are 2/4 bilateral. Capillary fill time is immediate to all digits.  Neurological: Grossly intact to light touch with an achilles reflex of +2/5 and a  negative Tinel's sign bilateral.  Musculoskeletal: Tenderness to palpation at the medial calcaneal tubercale and through the insertion of the plantar fascia on Bilateral feet. No pain with medial to lateral compression of calcaneus bilateral. No pain with tuning fork to calcaneus bilateral. No pain with calf compression bilateral. There is decreased Ankle joint range of motion bilateral. All other joints range of motion within normal limits bilateral. Strength 5/5 in all groups bilateral.    Xray, bilateral feet- 3 views Normal osseous mineralization. Joint spaces preserved. No fracture/dislocation/boney destruction. Small calcaneal spur present with mild thickening of plantar fascia. No other soft tissue abnormalities or radiopaque foreign bodies.   Assessment and Plan: Problem List Items Addressed This Visit   None   Visit Diagnoses    Pain of both heels    -  Primary   Relevant Orders   DG Foot Complete Right   DG Foot Complete Left  Plantar fasciitis, bilateral          -Complete examination performed.  -Xrays reviewed -Discussed with patient in detail the condition of plantar fasciitis, how this occurs and general treatment options.  -After oral consent and aseptic prep, injected a mixture containing 20mg  Kenalog and 5mg  marcaine plain was infiltrated into the area of maximal tenderness of the Bilateral heels. Post-injection care discussed with patient.  -Rx Lodine  500mg  bid to start for 2 weeks and taper was advised after 2 weeks.  -Explained and dispensed to patient daily stretching exercises.. -Patient to return to office in 4 weeks if symptoms worsen or fail to improve.

## 2020-09-18 ENCOUNTER — Other Ambulatory Visit: Payer: Self-pay

## 2020-09-18 ENCOUNTER — Ambulatory Visit (INDEPENDENT_AMBULATORY_CARE_PROVIDER_SITE_OTHER): Payer: 59 | Admitting: Internal Medicine

## 2020-09-18 ENCOUNTER — Encounter (INDEPENDENT_AMBULATORY_CARE_PROVIDER_SITE_OTHER): Payer: Self-pay | Admitting: Internal Medicine

## 2020-09-18 VITALS — BP 118/66 | HR 86 | Temp 97.5°F | Resp 18 | Ht 64.0 in | Wt 140.6 lb

## 2020-09-18 DIAGNOSIS — R232 Flushing: Secondary | ICD-10-CM

## 2020-09-18 MED ORDER — PROGESTERONE 200 MG PO CAPS: 200.0000 mg | ORAL_CAPSULE | Freq: Every evening | ORAL | 3 refills | Status: DC

## 2020-09-18 MED ORDER — ESTRADIOL 1 MG PO TABS: 1.0000 mg | ORAL_TABLET | Freq: Every morning | ORAL | 3 refills | Status: DC

## 2020-09-18 NOTE — Progress Notes (Signed)
Metrics: Intervention Frequency ACO  Documented Smoking Status Yearly  Screened one or more times in 24 months  Cessation Counseling or  Active cessation medication Past 24 months  Past 24 months   Guideline developer: UpToDate (See UpToDate for funding source) Date Released: 2014       Wellness Office Visit  Subjective:  Patient ID: Marcia Gonzales, female    DOB: May 04, 1961  Age: 60 y.o. MRN: [id number]  CC: This pleasant 60 year old lady comes to our practice as a new patient to establish care. HPI  She is taking combination hormone replacement therapy for hot flashes.  She was wondering about a topical medication.  She wanted to discuss more.  She still has uterus. History reviewed. No pertinent past medical history. History reviewed. No pertinent surgical history.   Family History  Problem Relation Age of Onset  . Kidney cancer Father   . Crohn's disease Brother     Social History   Social History Narrative   Single,lives alone.Unemployed currently.College English major.   Social History   Tobacco Use  . Smoking status: Never Smoker  . Smokeless tobacco: Never Used  Substance Use Topics  . Alcohol use: Yes    Comment: occ    Current Meds  Medication Sig  . estradiol (ESTRACE) 1 MG tablet Take 1 tablet (1 mg total) by mouth every morning.  . fluticasone (FLONASE) 50 MCG/ACT nasal spray 2 sprays by Each Nare route daily.  Marland Kitchen ibuprofen (ADVIL) 800 MG tablet Take 1 tablet (800 mg total) by mouth 3 (three) times daily.  . Multiple Vitamin (MULTI-VITAMIN) tablet Take 2 tablets by mouth daily.  . norethindrone-ethinyl estradiol (FEMHRT LOW DOSE) 0.5-2.5 MG-MCG tablet Take 1 tablet by mouth daily.  . progesterone (PROMETRIUM) 200 MG capsule Take 1 capsule (200 mg total) by mouth at bedtime.       Objective:   Today's Vitals: BP 118/66 (BP Location: Right Arm, Patient Position: Sitting, Cuff Size: Normal)   Pulse 86   Temp (!) 97.5 F (36.4 C) (Temporal)   Resp 18    Ht 5\' 4"  (1.626 m)   Wt 140 lb 9.6 oz (63.8 kg)   SpO2 97%   BMI 24.13 kg/m  Vitals with BMI 09/18/2020 08/05/2019  Height 5\' 4"  5\' 5"   Weight 140 lbs 10 oz 135 lbs  BMI 24.12 22.47  Systolic 118 143  Diastolic 66 84  Pulse 86 101     Physical Exam  She looks systemically well.  Excellent blood pressure.     Assessment   1. Hot flashes       Tests ordered No orders of the defined types were placed in this encounter.    Plan: 1. We discussed the medication she is on which is a combination of estradiol and a synthetic progestogen.  We discussed the women's health initiative study, the difference between bioidentical and synthetic hormones and the rationale behind oral estradiol.  After shared decision making and information that I gave her, she is willing to change her hormone replacement therapy to estradiol and progesterone and I prescribed this now.  I have told her of possible side effects and how to deal with them. 2. Follow-up in about 6 weeks to see how she is doing and we will do all the blood work then.   Meds ordered this encounter  Medications  . estradiol (ESTRACE) 1 MG tablet    Sig: Take 1 tablet (1 mg total) by mouth every morning.    Dispense:  30 tablet    Refill:  3  . progesterone (PROMETRIUM) 200 MG capsule    Sig: Take 1 capsule (200 mg total) by mouth at bedtime.    Dispense:  30 capsule    Refill:  3    Marcia Unrein Normajean Glasgow, MD

## 2020-09-19 ENCOUNTER — Other Ambulatory Visit (INDEPENDENT_AMBULATORY_CARE_PROVIDER_SITE_OTHER): Payer: Self-pay | Admitting: Internal Medicine

## 2020-09-19 ENCOUNTER — Telehealth (INDEPENDENT_AMBULATORY_CARE_PROVIDER_SITE_OTHER): Payer: Self-pay

## 2020-09-19 MED ORDER — ESTRADIOL 1 MG PO TABS: 1.0000 mg | ORAL_TABLET | Freq: Every morning | ORAL | 3 refills | Status: DC

## 2020-09-19 MED ORDER — PROGESTERONE 200 MG PO CAPS: 200.0000 mg | ORAL_CAPSULE | Freq: Every evening | ORAL | 3 refills | Status: DC

## 2020-09-19 NOTE — Telephone Encounter (Signed)
Okay, let the patient know that I have sent estradiol and progesterone to the [facility] on [address].

## 2020-09-19 NOTE — Telephone Encounter (Signed)
Patient called and stated that she told us the wrong pharmacy by accident and she needs her estradiol and progesterone sent to [facility] on [address] In [place] please. Patient apologized and stated that she did not mean to say the [facility].

## 2020-10-17 ENCOUNTER — Ambulatory Visit (INDEPENDENT_AMBULATORY_CARE_PROVIDER_SITE_OTHER): Payer: Self-pay | Admitting: Primary Care

## 2020-10-27 ENCOUNTER — Ambulatory Visit: Payer: 59 | Admitting: Internal Medicine

## 2020-11-02 ENCOUNTER — Other Ambulatory Visit: Payer: Self-pay

## 2020-11-02 ENCOUNTER — Ambulatory Visit (INDEPENDENT_AMBULATORY_CARE_PROVIDER_SITE_OTHER): Payer: 59 | Admitting: Internal Medicine

## 2020-11-02 ENCOUNTER — Encounter (INDEPENDENT_AMBULATORY_CARE_PROVIDER_SITE_OTHER): Payer: Self-pay | Admitting: Internal Medicine

## 2020-11-02 VITALS — BP 110/66 | HR 99 | Temp 98.0°F | Resp 18 | Ht 64.0 in | Wt 147.0 lb

## 2020-11-02 DIAGNOSIS — F520 Hypoactive sexual desire disorder: Secondary | ICD-10-CM

## 2020-11-02 DIAGNOSIS — R232 Flushing: Secondary | ICD-10-CM

## 2020-11-02 DIAGNOSIS — R5383 Other fatigue: Secondary | ICD-10-CM

## 2020-11-02 DIAGNOSIS — E559 Vitamin D deficiency, unspecified: Secondary | ICD-10-CM

## 2020-11-02 DIAGNOSIS — E782 Mixed hyperlipidemia: Secondary | ICD-10-CM

## 2020-11-02 DIAGNOSIS — R5381 Other malaise: Secondary | ICD-10-CM

## 2020-11-02 NOTE — Progress Notes (Signed)
[facility]  She wants to go to [facility]

## 2020-11-02 NOTE — Progress Notes (Signed)
Metrics: Intervention Frequency ACO  Documented Smoking Status Yearly  Screened one or more times in 24 months  Cessation Counseling or  Active cessation medication Past 24 months  Past 24 months   Guideline developer: UpToDate (See UpToDate for funding source) Date Released: 2014       Wellness Office Visit  Subjective:  Patient ID: Marcia Gonzales, female    DOB: 06/11/1960  Age: 60 y.o. MRN: [id number]  CC: This lady comes in for follow-up regarding her hot flashes and menopausal symptoms. HPI  On the last visit, I started her on estradiol and progesterone and she is tolerating this very well.  She has no hot flashes now.  She has discontinued synthetic hormones.  In the past, she was taking levothyroxine but has not been taking this for a long time. Also, she does have a history of significant hyperlipidemia in the past.  This has not been checked recently. She does describe intermittent fatigue. On closer questioning, she also describes decreased libido compared to several years ago.  She is not currently in a relationship. History reviewed. No pertinent past medical history. History reviewed. No pertinent surgical history.   Family History  Problem Relation Age of Onset   Kidney cancer Father    Crohn's disease Brother     Social History   Social History Narrative   Single,lives alone.Unemployed currently.College English major.   Social History   Tobacco Use   Smoking status: Never   Smokeless tobacco: Never  Substance Use Topics   Alcohol use: Yes    Comment: occ    Current Meds  Medication Sig   cyanocobalamin 1000 MCG tablet Take by mouth.   estradiol (ESTRACE) 1 MG tablet Take 1 tablet (1 mg total) by mouth every morning.   fluticasone (FLONASE) 50 MCG/ACT nasal spray 2 sprays by Each Nare route daily.   Multiple Vitamin (MULTI-VITAMIN) tablet Take 2 tablets by mouth daily.   progesterone (PROMETRIUM) 200 MG capsule Take 1 capsule (200 mg total) by mouth at  bedtime.   [DISCONTINUED] cyclobenzaprine (FLEXERIL) 10 MG tablet Take 1 tablet (10 mg total) by mouth 2 (two) times daily as needed for muscle spasms.   [DISCONTINUED] etodolac (LODINE) 500 MG tablet Take 1 tablet (500 mg total) by mouth 2 (two) times daily.   [DISCONTINUED] ibuprofen (ADVIL) 800 MG tablet Take 1 tablet (800 mg total) by mouth 3 (three) times daily.   [DISCONTINUED] levothyroxine (SYNTHROID) 50 MCG tablet Take by mouth.   [DISCONTINUED] norethindrone-ethinyl estradiol (FEMHRT LOW DOSE) 0.5-2.5 MG-MCG tablet Take 1 tablet by mouth daily.       Objective:   Today's Vitals: BP 110/66 (BP Location: Right Arm, Patient Position: Sitting, Cuff Size: Normal)   Pulse 99   Temp 98 F (36.7 C) (Temporal)   Resp 18   Ht 5\' 4"  (1.626 m)   Wt 147 lb (66.7 kg)   SpO2 98%   BMI 25.23 kg/m  Vitals with BMI 11/02/2020 09/18/2020 08/05/2019  Height 5\' 4"  5\' 4"  5\' 5"   Weight 147 lbs 140 lbs 10 oz 135 lbs  BMI 25.22 24.12 22.47  Systolic 110 118 08/07/2019  Diastolic 66 66 84  Pulse 99 86 101     Physical Exam   She looks systemically well.  Fairly healthy weight.  Blood pressure is excellent.    Assessment   1. Hot flashes   2. Malaise and fatigue   3. Vitamin D deficiency disease   4. Hypoactive sexual desire   5.  Mixed hyperlipidemia       Tests ordered Orders Placed This Encounter  Procedures   CBC   COMPLETE METABOLIC PANEL WITH GFR   Estradiol   Progesterone   Testos,Total,Free and SHBG (Female)   T3, free   T4, free   TSH   VITAMIN D 25 Hydroxy (Vit-D Deficiency, Fractures)   Lipid panel      Plan: 1.  Continue with the current dose of estradiol and progesterone.  We will check levels. 2.  Other blood work is ordered in view of her other symptoms. 3.  I will see her in about 3 months time for follow-up and further recommendations will depend on blood results    No orders of the defined types were placed in this encounter.   Wilson Singer,  MD

## 2020-11-07 LAB — T3, FREE: T3, Free: 3 pg/mL (ref 2.3–4.2)

## 2020-11-07 LAB — T4, FREE: Free T4: 1.1 ng/dL (ref 0.8–1.8)

## 2020-11-07 LAB — COMPLETE METABOLIC PANEL WITH GFR
AG Ratio: 1.8 (calc) (ref 1.0–2.5)
ALT: 27 U/L (ref 6–29)
AST: 25 U/L (ref 10–35)
Albumin: 4.2 g/dL (ref 3.6–5.1)
Alkaline phosphatase (APISO): 49 U/L (ref 37–153)
BUN/Creatinine Ratio: 30 (calc) — ABNORMAL HIGH (ref 6–22)
BUN: 28 mg/dL — ABNORMAL HIGH (ref 7–25)
CO2: 28 mmol/L (ref 20–32)
Calcium: 9.5 mg/dL (ref 8.6–10.4)
Chloride: 104 mmol/L (ref 98–110)
Creat: 0.94 mg/dL (ref 0.50–1.05)
GFR, Est African American: 77 mL/min/{1.73_m2} (ref >=60)
GFR, Est Non African American: 66 mL/min/{1.73_m2} (ref >=60)
Globulin: 2.3 g/dL (calc) (ref 1.9–3.7)
Glucose, Bld: 155 mg/dL — ABNORMAL HIGH (ref 65–139)
Potassium: 4.1 mmol/L (ref 3.5–5.3)
Sodium: 140 mmol/L (ref 135–146)
Total Bilirubin: 0.6 mg/dL (ref 0.2–1.2)
Total Protein: 6.5 g/dL (ref 6.1–8.1)

## 2020-11-07 LAB — CBC
HCT: 42.2 % (ref 35.0–45.0)
Hemoglobin: 14 g/dL (ref 11.7–15.5)
MCH: 31.4 pg (ref 27.0–33.0)
MCHC: 33.2 g/dL (ref 32.0–36.0)
MCV: 94.6 fL (ref 80.0–100.0)
MPV: 11.4 fL (ref 7.5–12.5)
Platelets: 221 10*3/uL (ref 140–400)
RBC: 4.46 10*6/uL (ref 3.80–5.10)
RDW: 12.2 % (ref 11.0–15.0)
WBC: 8.1 10*3/uL (ref 3.8–10.8)

## 2020-11-07 LAB — VITAMIN D 25 HYDROXY (VIT D DEFICIENCY, FRACTURES): Vit D, 25-Hydroxy: 39 ng/mL (ref 30–100)

## 2020-11-07 LAB — LIPID PANEL
Cholesterol: 263 mg/dL — ABNORMAL HIGH (ref <200)
HDL: 80 mg/dL (ref >=50)
LDL Cholesterol (Calc): 167 mg/dL (calc) — ABNORMAL HIGH
Non-HDL Cholesterol (Calc): 183 mg/dL (calc) — ABNORMAL HIGH (ref <130)
Total CHOL/HDL Ratio: 3.3 (calc) (ref <5.0)
Triglycerides: 69 mg/dL (ref <150)

## 2020-11-07 LAB — TSH: TSH: 2.52 mIU/L (ref 0.40–4.50)

## 2020-11-07 LAB — PROGESTERONE: Progesterone: 10.5 ng/mL

## 2020-11-07 LAB — ESTRADIOL: Estradiol: 32 pg/mL

## 2020-11-07 LAB — TESTOS,TOTAL,FREE AND SHBG (FEMALE)
Free Testosterone: 1.3 pg/mL (ref 0.1–6.4)
Sex Hormone Binding: 98 nmol/L — ABNORMAL HIGH (ref 14–73)
Testosterone, Total, LC-MS-MS: 16 ng/dL (ref 2–45)

## 2020-12-21 ENCOUNTER — Other Ambulatory Visit (INDEPENDENT_AMBULATORY_CARE_PROVIDER_SITE_OTHER): Payer: Self-pay | Admitting: Internal Medicine

## 2021-02-07 ENCOUNTER — Ambulatory Visit (INDEPENDENT_AMBULATORY_CARE_PROVIDER_SITE_OTHER): Payer: 59 | Admitting: Internal Medicine
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
